# Patient Record
Sex: Male | Born: 1985 | Race: Black or African American | Hispanic: No | Marital: Married | Smoking: Current every day smoker
Health system: Southern US, Community
[De-identification: ages and names within clinical notes are randomized; demographics above are authoritative.]

## PROBLEM LIST (undated history)

## (undated) HISTORY — PX: HAND SURGERY: SHX662

---

## 2008-11-12 ENCOUNTER — Emergency Department (HOSPITAL_COMMUNITY): Admission: EM | Admit: 2008-11-12 | Discharge: 2008-11-12 | Payer: Self-pay | Admitting: Emergency Medicine

## 2015-04-14 ENCOUNTER — Emergency Department (HOSPITAL_BASED_OUTPATIENT_CLINIC_OR_DEPARTMENT_OTHER)
Admission: EM | Admit: 2015-04-14 | Discharge: 2015-04-14 | Disposition: A | Discharge status: Home / Self Care | Payer: Worker's Compensation | Attending: Emergency Medicine | Admitting: Emergency Medicine

## 2015-04-14 ENCOUNTER — Encounter (HOSPITAL_BASED_OUTPATIENT_CLINIC_OR_DEPARTMENT_OTHER): Payer: Self-pay | Admitting: *Deleted

## 2015-04-14 ENCOUNTER — Emergency Department (HOSPITAL_BASED_OUTPATIENT_CLINIC_OR_DEPARTMENT_OTHER): Payer: Worker's Compensation

## 2015-04-14 DIAGNOSIS — Y9367 Activity, basketball: Secondary | ICD-10-CM | POA: Insufficient documentation

## 2015-04-14 DIAGNOSIS — Y92310 Basketball court as the place of occurrence of the external cause: Secondary | ICD-10-CM | POA: Insufficient documentation

## 2015-04-14 DIAGNOSIS — S86912A Strain of unspecified muscle(s) and tendon(s) at lower leg level, left leg, initial encounter: Secondary | ICD-10-CM | POA: Diagnosis not present

## 2015-04-14 DIAGNOSIS — F17200 Nicotine dependence, unspecified, uncomplicated: Secondary | ICD-10-CM | POA: Diagnosis not present

## 2015-04-14 DIAGNOSIS — W228XXA Striking against or struck by other objects, initial encounter: Secondary | ICD-10-CM | POA: Insufficient documentation

## 2015-04-14 DIAGNOSIS — S8992XA Unspecified injury of left lower leg, initial encounter: Secondary | ICD-10-CM | POA: Diagnosis present

## 2015-04-14 DIAGNOSIS — Y998 Other external cause status: Secondary | ICD-10-CM | POA: Insufficient documentation

## 2015-04-14 MED ORDER — NAPROXEN 500 MG PO TABS: 500.0000 mg | ORAL_TABLET | Freq: Two times a day (BID) | ORAL | Status: AC

## 2015-04-14 NOTE — Discharge Instructions (Signed)
Muscle Strain A muscle strain (pulled muscle) happens when a muscle is stretched beyond normal length. It happens when a sudden, violent force stretches your muscle too far. Usually, a few of the fibers in your muscle are torn. Muscle strain is common in athletes. Recovery usually takes 1-2 weeks. Complete healing takes 5-6 weeks.  HOME CARE   Follow the PRICE method of treatment to help your injury get better. Do this the first 2-3 days after the injury:  Protect. Protect the muscle to keep it from getting injured again.  Rest. Limit your activity and rest the injured body part.  Ice. Put ice in a plastic bag. Place a towel between your skin and the bag. Then, apply the ice and leave it on from 15-20 minutes each hour. After the third day, switch to moist heat packs.  Compression. Use a splint or elastic bandage on the injured area for comfort. Do not put it on too tightly.  Elevate. Keep the injured body part above the level of your heart.  Only take medicine as told by your doctor.  Warm up before doing exercise to prevent future muscle strains. GET HELP IF:   You have more pain or puffiness (swelling) in the injured area.  You feel numbness, tingling, or notice a loss of strength in the injured area. MAKE SURE YOU:   Understand these instructions.  Will watch your condition.  Will get help right away if you are not doing well or get worse.   This information is not intended to replace advice given to you by your health care provider. Make sure you discuss any questions you have with your health care provider.  Take the Naprosyn as directed. Use the crutches as needed. Make an appointment to follow-up with sports medicine if not improving in a few days. Doppler study was negative.    Document Released: 11/20/2007 Document Revised: 12/01/2012 Document Reviewed: 09/09/2012 Elsevier Interactive Patient Education [employer]

## 2015-04-14 NOTE — ED Notes (Signed)
States was playing basketball yesterday, states did not fall or twist leg, states it felt as if he was "punched" in the left calf area, states since incident unable to walk or place wt on LLE

## 2015-04-14 NOTE — ED Notes (Signed)
Left calf pain since yesterday- pain started while playing basketball- denies injury

## 2015-04-14 NOTE — ED Provider Notes (Signed)
CSN: [id number]     Arrival date & time 04/14/15  1126 History   First MD Initiated Contact with Dave King 04/14/15 1218     Chief Complaint  Dave King presents with  . Leg Pain     (Consider location/radiation/quality/duration/timing/severity/associated sxs/prior Treatment) Dave King is a 30 y.o. male presenting with leg pain. The history is provided by the Dave King.  Leg Pain Associated symptoms: no back pain, no fever and no neck pain    Dave King with complaint of left calf pain. Dave King did not fall no 1 landed on it did not twist leg. States it felt as if he got punched in the left calf area. Since that time it's been painful to walk. Pain is in the left calf area. No other injuries. No shortness of breath no chest pain no nausea vomiting no abdominal pain. Right leg is fine. Left foot feels fine.  History reviewed. No pertinent past medical history. Past Surgical History  Procedure Laterality Date  . Hand surgery     No family history on file. Social History  Substance Use Topics  . Smoking status: Current Every Day Smoker  . Smokeless tobacco: Never Used  . Alcohol Use: Yes     Comment: 2x week    Review of Systems  Constitutional: Negative for fever.  HENT: Negative for congestion.   Eyes: Negative for visual disturbance.  Respiratory: Negative for shortness of breath.   Cardiovascular: Negative for chest pain.  Gastrointestinal: Negative for abdominal pain.  Genitourinary: Negative for dysuria.  Musculoskeletal: Negative for back pain and neck pain.  Skin: Negative for rash and wound.  Neurological: Negative for headaches.  Hematological: Does not bruise/bleed easily.  Psychiatric/Behavioral: Negative for confusion.      Allergies  Review of Dave King's allergies indicates no known allergies.  Home Medications   Prior to Admission medications   Medication Sig Start Date End Date Taking? Authorizing Provider  naproxen (NAPROSYN) 500 MG tablet Take 1 tablet (500 mg  total) by mouth 2 (two) times daily. 04/14/15   Vanetta Mulders, MD   BP 122/70 mmHg  Pulse 58  Temp(Src) 98.3 F (36.8 C) (Oral)  Resp 20  Ht  (1.727 m)  Wt 97.523 kg  BMI 32.70 kg/m2  SpO2 100% Physical Exam  Constitutional: He is oriented to person, place, and time. He appears well-developed and well-nourished. No distress.  HENT:  Head: Normocephalic and atraumatic.  Mouth/Throat: Oropharynx is clear and moist.  Eyes: Conjunctivae and EOM are normal. Pupils are equal, round, and reactive to light.  Neck: Normal range of motion. Neck supple.  Cardiovascular: Normal rate, regular rhythm and normal heart sounds.   No murmur heard. Pulmonary/Chest: Effort normal and breath sounds normal. No respiratory distress.  Abdominal: Soft. Bowel sounds are normal. There is no tenderness.  Musculoskeletal: Normal range of motion. He exhibits tenderness.  Left calf tenderness and the bulk of the muscle. No ankle swelling. Dorsalis pedis pulse to left foot 2+. Sensation intact. Achilles tendon nontender. Achilles tendon intact.  Neurological: He is alert and oriented to person, place, and time. No cranial nerve deficit. He exhibits normal muscle tone. Coordination normal.  Skin: Skin is warm. No rash noted.  Nursing note and vitals reviewed.   ED Course  Procedures (including critical care time) Labs Review Labs Reviewed - No data to display  Imaging Review US Venous Img Lower Unilateral Left  04/14/2015  CLINICAL DATA:  Pt was playing basketball yesterday and heard a pop noise in left medial  calf and instant pain and pressure with ambulation. Pain has progressed and slight swelling today with redness. Pain less severe with elevation and rest. EXAM: LEFT LOWER EXTREMITY VENOUS DOPPLER ULTRASOUND TECHNIQUE: Gray-scale sonography with graded compression, as well as color Doppler and duplex ultrasound were performed to evaluate the lower extremity deep venous systems from the level of the  common femoral vein and including the common femoral, femoral, profunda femoral, popliteal and calf veins including the posterior tibial, peroneal and gastrocnemius veins when visible. The superficial great saphenous vein was also interrogated. Spectral Doppler was utilized to evaluate flow at rest and with distal augmentation maneuvers in the common femoral, femoral and popliteal veins. COMPARISON:  None. FINDINGS: Contralateral Common Femoral Vein: Respiratory phasicity is normal and symmetric with the symptomatic side. No evidence of thrombus. Normal compressibility. Common Femoral Vein: No evidence of thrombus. Normal compressibility, respiratory phasicity and response to augmentation. Saphenofemoral Junction: No evidence of thrombus. Normal compressibility and flow on color Doppler imaging. Profunda Femoral Vein: No evidence of thrombus. Normal compressibility and flow on color Doppler imaging. Femoral Vein: No evidence of thrombus. Normal compressibility, respiratory phasicity and response to augmentation. Popliteal Vein: No evidence of thrombus. Normal compressibility, respiratory phasicity and response to augmentation. Calf Veins: No evidence of thrombus. Normal compressibility and flow on color Doppler imaging. Superficial Great Saphenous Vein: No evidence of thrombus. Normal compressibility and flow on color Doppler imaging. Venous Reflux:  None. Other Findings: A small amount of fluid tracks along the fascia layers of the mid calf. IMPRESSION: No evidence of deep venous thrombosis. Electronically Signed   By: Amie Portland M.D.   On: 04/14/2015 13:25   I have personally reviewed and evaluated these images and lab results as part of my medical decision-making.   EKG Interpretation None      MDM   Final diagnoses:  Muscle strain, lower leg, left, initial encounter    Dave King without direct injury to the leg consistent with a pulled muscle. Clinically no tenderness along the Achilles tendon.  Tenderness all up in the calf muscle. Doppler studies negative for a deep vein thrombosis. Will treat symptomatically with anti-inflammatories and temporarily with crutches and follow-up with sports medicine. No concern for acute fracture.    Vanetta Mulders, MD 04/14/15 1428

## 2015-04-14 NOTE — ED Notes (Signed)
No deformity or swelling noted of LLE compared to RLE

## 2015-04-14 NOTE — ED Notes (Signed)
Pain in left calf to left post knee increases with dorsal flexion of LLE

## 2015-04-18 ENCOUNTER — Encounter: Payer: Self-pay | Admitting: Family Medicine

## 2015-04-18 ENCOUNTER — Ambulatory Visit (INDEPENDENT_AMBULATORY_CARE_PROVIDER_SITE_OTHER): Payer: Worker's Compensation | Admitting: Family Medicine

## 2015-04-18 VITALS — BP 135/96 | HR 63 | Ht 69.0 in | Wt 215.0 lb

## 2015-04-18 DIAGNOSIS — S86112A Strain of other muscle(s) and tendon(s) of posterior muscle group at lower leg level, left leg, initial encounter: Secondary | ICD-10-CM | POA: Diagnosis not present

## 2015-04-18 NOTE — Patient Instructions (Signed)
You have a calf strain. Compression sleeve or ace wrap to help with swelling and pain. Icing for 15 minutes at a time 3-4 times a day Heel lifts either in temporary orthotics or on their own to prevent further strain. Crutches if needed Tylenol and/or aleve for pain. Start ankle range of motion exercises (up/down and alphabet exercises). When your pain is down to 2-3 out of 10 you can start calf raises. Cycling with low resistance or swimming for cross training in meantime. Follow up with me in 2 weeks.

## 2015-04-19 DIAGNOSIS — S86112A Strain of other muscle(s) and tendon(s) of posterior muscle group at lower leg level, left leg, initial encounter: Secondary | ICD-10-CM | POA: Insufficient documentation

## 2015-04-19 NOTE — Assessment & Plan Note (Signed)
Start with ACE wrap, icing, heel lifts.  Crutches as needed.  Start motion exercises, advance to calf raises.  Cross training in meantime.  F/u in 6 weeks.

## 2015-04-19 NOTE — Progress Notes (Signed)
PCP: No primary care provider on file.  Subjective:   HPI: Patient is a 30 y.o. male here for left leg pain.  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 Patient reports he felt like he got struck in left medial calf while playing. No prior injuries. Pain is 7/10, sharp. Worse with walking. Using a single crutch now, taking naproxen, elevating. No skin changes, fever, other complaints.  No past medical history on file.  Current Outpatient Prescriptions on File Prior to Visit  Medication Sig Dispense Refill  . naproxen (NAPROSYN) 500 MG tablet Take 1 tablet (500 mg total) by mouth 2 (two) times daily. 14 tablet 1   No current facility-administered medications on file prior to visit.    Past Surgical History  Procedure Laterality Date  . Hand surgery      No Known Allergies  Social History   Social History  . Marital Status: Married    Spouse Name: N/A  . Number of Children: N/A  . Years of Education: N/A   Occupational History  . Not on file.   Social History Main Topics  . Smoking status: Current Every Day Smoker  . Smokeless tobacco: Never Used  . Alcohol Use: 0.0 oz/week    0 Standard drinks or equivalent per week     Comment: 2x week  . Drug Use: No  . Sexual Activity: Not on file   Other Topics Concern  . Not on file   Social History Narrative    No family history on file.  BP 135/96 mmHg  Pulse 63  Ht  (1.753 m)  Wt 215 lb (97.523 kg)  BMI 31.74 kg/m2  Review of Systems: See HPI above.    Objective:  Physical Exam:  Gen: NAD, comfortable in exam room  Left lower leg: Mod swelling of calf, no bruising, deformity or cords. TTP medial gastroc.  No other tenderness. FROM ankle and knee.  Pain on attempted calf raise. NVI distally.  Right lower leg: FROM ankle and knee without pain. No calf swelling.    Assessment & Plan:  1. Left calf strain - Start with ACE wrap, icing, heel lifts.   Crutches as needed.  Start motion exercises, advance to calf raises.  Cross training in meantime.  F/u in 6 weeks.

## 2015-05-02 ENCOUNTER — Ambulatory Visit: Payer: Worker's Compensation | Admitting: Family Medicine

## 2015-05-07 ENCOUNTER — Ambulatory Visit (INDEPENDENT_AMBULATORY_CARE_PROVIDER_SITE_OTHER): Payer: Worker's Compensation | Admitting: Family Medicine

## 2015-05-07 ENCOUNTER — Encounter: Payer: Self-pay | Admitting: Family Medicine

## 2015-05-07 VITALS — BP 125/85 | HR 80 | Ht 69.0 in | Wt 214.0 lb

## 2015-05-07 DIAGNOSIS — S86112D Strain of other muscle(s) and tendon(s) of posterior muscle group at lower leg level, left leg, subsequent encounter: Secondary | ICD-10-CM | POA: Diagnosis not present

## 2015-05-07 DIAGNOSIS — S86812D Strain of other muscle(s) and tendon(s) at lower leg level, left leg, subsequent encounter: Secondary | ICD-10-CM | POA: Diagnosis not present

## 2015-05-09 NOTE — Progress Notes (Signed)
PCP: No primary care provider on file.  Subjective:   HPI: Patient is a 30 y.o. male here for left leg pain.  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 Patient reports he felt like he got struck in left medial calf while playing. No prior injuries. Pain is 7/10, sharp. Worse with walking. Using a single crutch now, taking naproxen, elevating. No skin changes, fever, other complaints.  3/13: Patient reports he feels significantly better now. Using the sleeve off and on now. Little sore at end of day but otherwise no problems. Pain 0/10 currently. No skin changes, fever, other complaints.  No past medical history on file.  Current Outpatient Prescriptions on File Prior to Visit  Medication Sig Dispense Refill  . imiquimod (ALDARA) 5 % cream APPLY A THIN LAYER, LEAVE ON FOR 6 TO 10 HOURS THEN WASH OFF. USE ON MONDAYS, WEDNESDAYS, & FRIDAYS  0  . naproxen (NAPROSYN) 500 MG tablet Take 1 tablet (500 mg total) by mouth 2 (two) times daily. 14 tablet 1   No current facility-administered medications on file prior to visit.    Past Surgical History  Procedure Laterality Date  . Hand surgery      No Known Allergies  Social History   Social History  . Marital Status: Married    Spouse Name: N/A  . Number of Children: N/A  . Years of Education: N/A   Occupational History  . Not on file.   Social History Main Topics  . Smoking status: Current Every Day Smoker  . Smokeless tobacco: Never Used  . Alcohol Use: 0.0 oz/week    0 Standard drinks or equivalent per week     Comment: 2x week  . Drug Use: No  . Sexual Activity: Not on file   Other Topics Concern  . Not on file   Social History Narrative    No family history on file.  BP 125/85 mmHg  Pulse 80  Ht 5\' 9"  (1.753 m)  Wt 214 lb (97.07 kg)  BMI 31.59 kg/m2  Review of Systems: See HPI above.    Objective:  Physical Exam:  Gen: NAD, comfortable in exam  room  Left lower leg: Minimal swelling of calf, no bruising, deformity or cords. No TTP medial gastroc.  No other tenderness. FROM ankle and knee.  Able to do single leg calf raise without pain. NVI distally.  Right lower leg: FROM ankle and knee without pain. No calf swelling.    Assessment & Plan:  1. Left calf strain - Significant improvement to date with compression, icing, heel lifts, home exercises.  Shown additional exercises to do for next 4-6 weeks.  F/u prn.

## 2015-05-09 NOTE — Assessment & Plan Note (Signed)
Significant improvement to date with compression, icing, heel lifts, home exercises.  Shown additional exercises to do for next 4-6 weeks.  F/u prn.

## 2017-11-19 IMAGING — US US EXTREM LOW VENOUS*L*
1 series · 13 of 24 positions shown · non-contrast
Comparison: None.

CLINICAL DATA: Pt was playing basketball yesterday and heard a pop
noise in left medial calf and instant pain and pressure with
ambulation. Pain has progressed and slight swelling today with
redness. Pain less severe with elevation and rest.



[Series 1: us extrem low venous*left* · 0.08mm/px · 46 acquisitions, 13 frames shown]
[im 1/46]
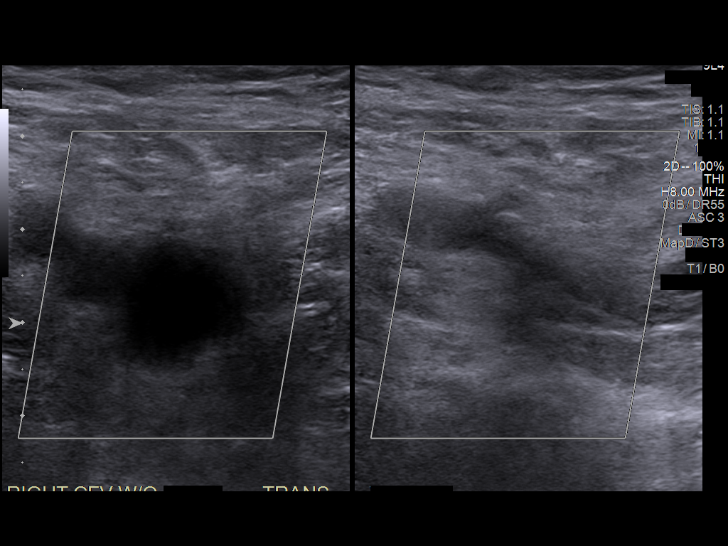
[im 4/46]
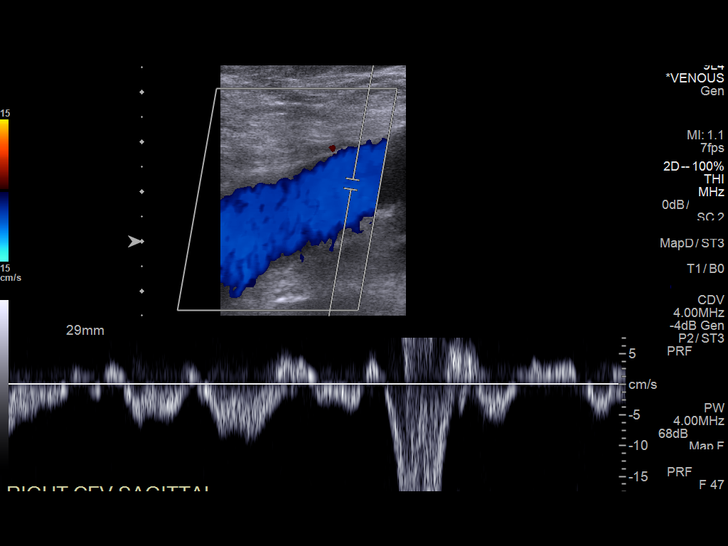
[im 8/46]
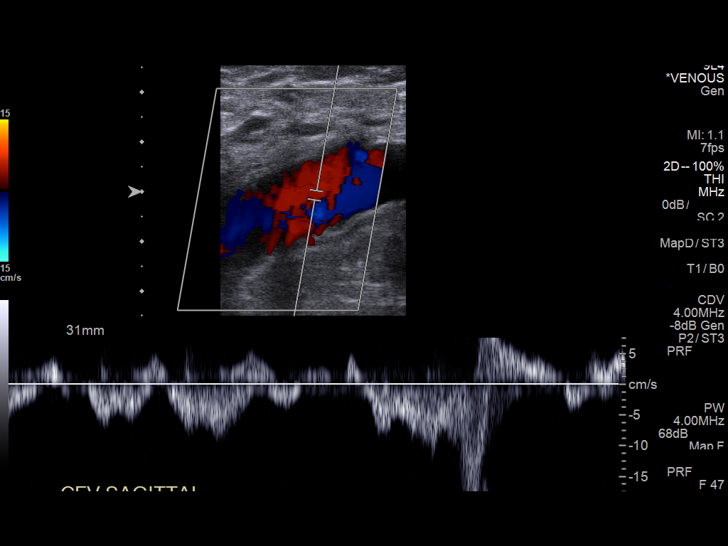
[im 12/46]
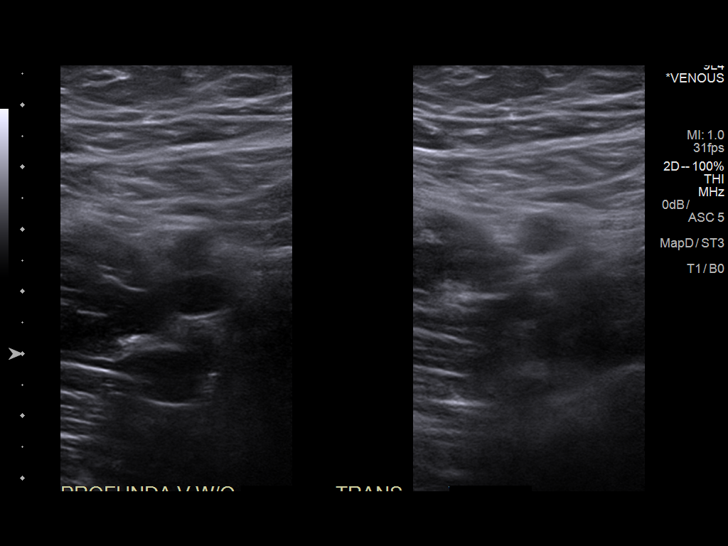
[im 16/46]
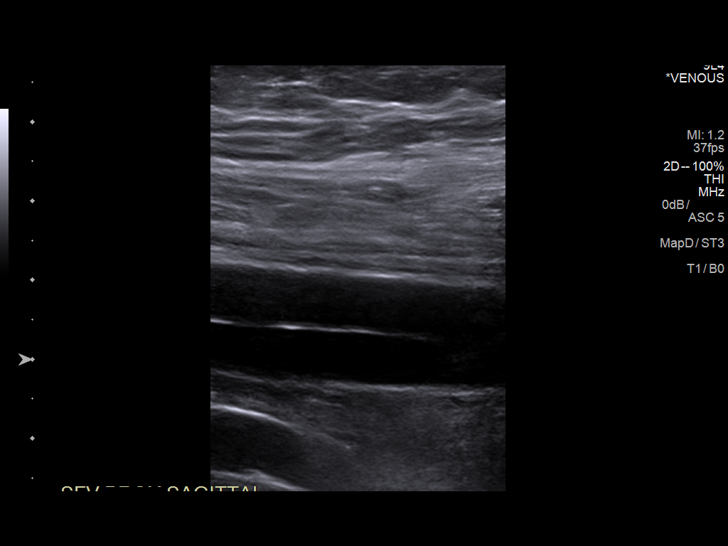
[im 20/46]
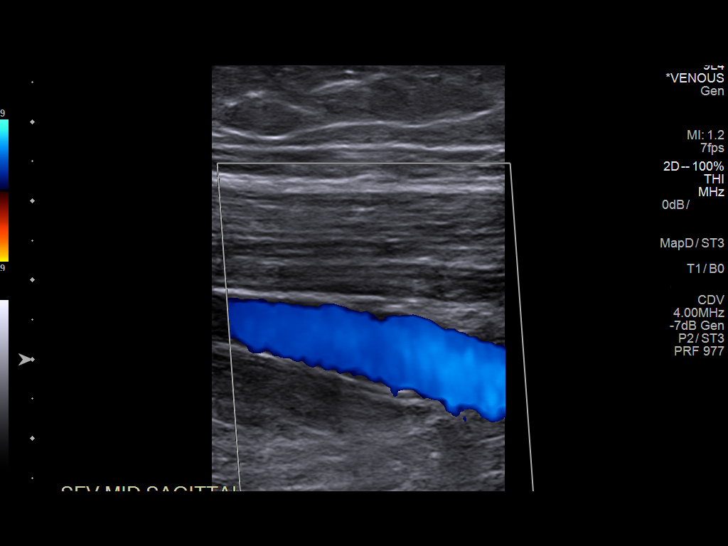
[im 26/46]
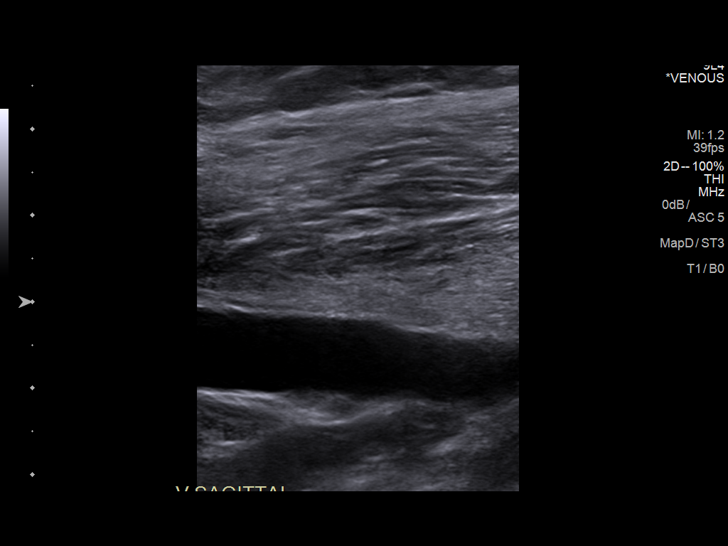
[im 28/46]
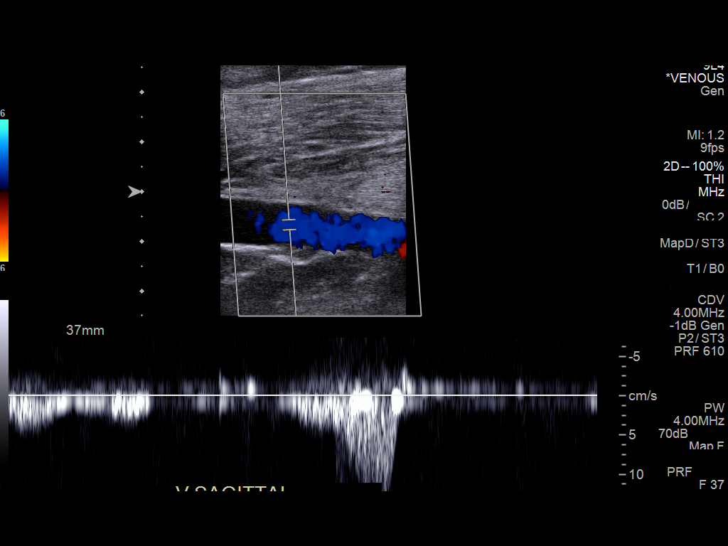
[im 32/46]
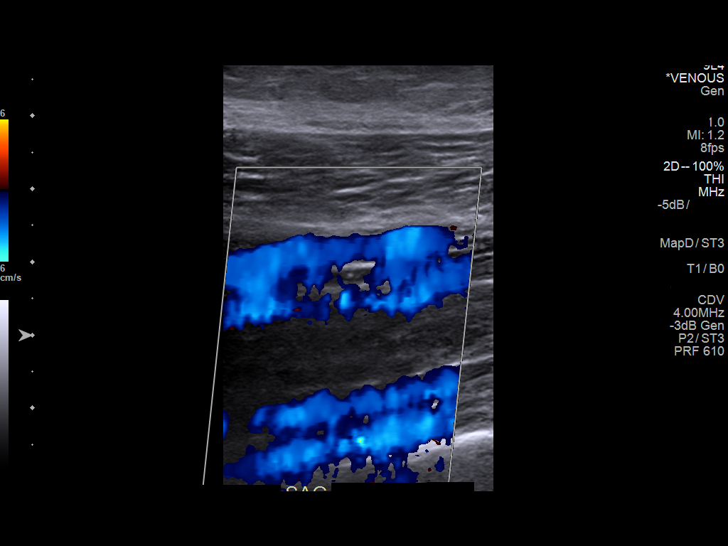
[im 36/46]
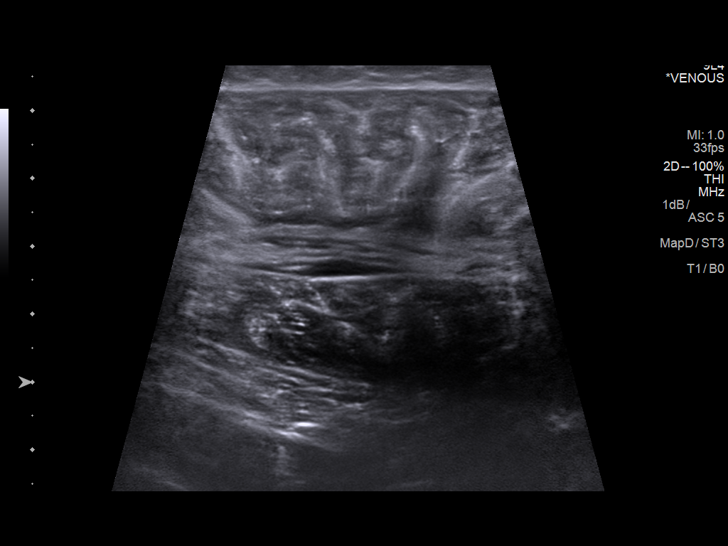
[im 40/46]
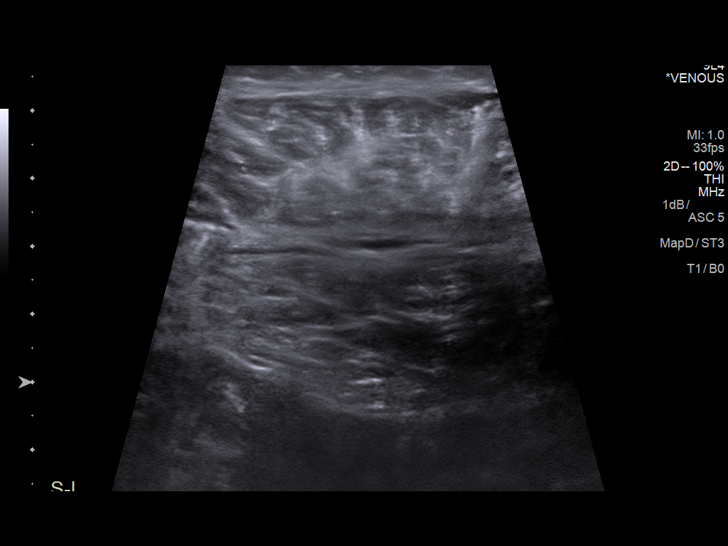
[im 42/46]
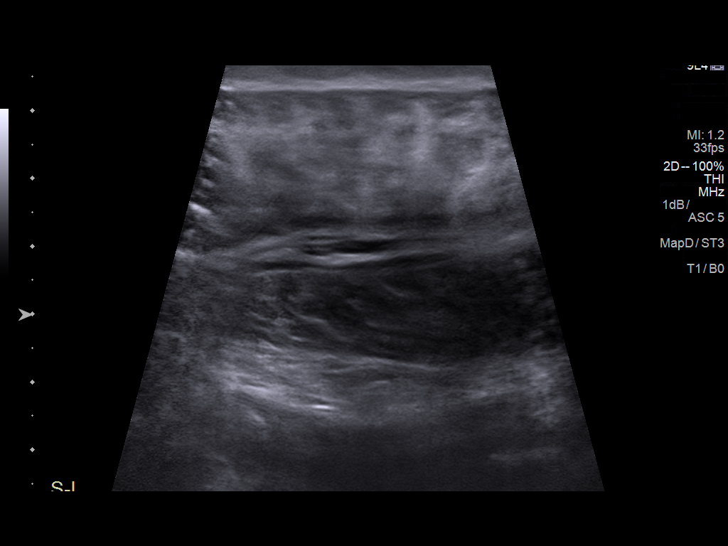
[im 46/46]
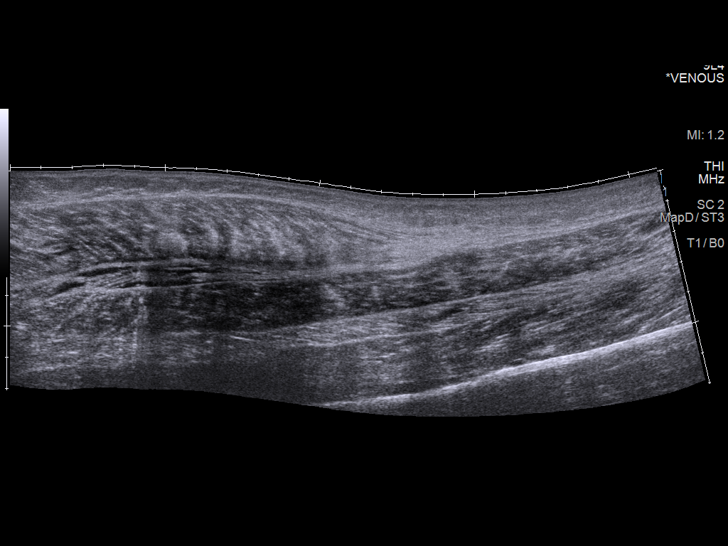

[13 of 24 positions shown; findings below may reference images not displayed]

FINDINGS: Contralateral Common Femoral Vein: Respiratory phasicity is normal
and symmetric with the symptomatic side. No evidence of thrombus.
Normal compressibility.

Common Femoral Vein: No evidence of thrombus. Normal
compressibility, respiratory phasicity and response to augmentation.

Saphenofemoral Junction: No evidence of thrombus. Normal
compressibility and flow on color Doppler imaging.

Profunda Femoral Vein: No evidence of thrombus. Normal
compressibility and flow on color Doppler imaging.

Femoral Vein: No evidence of thrombus. Normal compressibility,
respiratory phasicity and response to augmentation.

Popliteal Vein: No evidence of thrombus. Normal compressibility,
respiratory phasicity and response to augmentation.

Calf Veins: No evidence of thrombus. Normal compressibility and flow
on color Doppler imaging.

Superficial Great Saphenous Vein: No evidence of thrombus. Normal
compressibility and flow on color Doppler imaging.

Venous Reflux:  None.

Other Findings: A small amount of fluid tracks along the fascia
layers of the mid calf.
IMPRESSION: No evidence of deep venous thrombosis.
